# Patient Record
Sex: Male | Born: 1975 | Hispanic: No | Marital: Married | Smoking: Current some day smoker
Health system: Southern US, Community
[De-identification: ages and names within clinical notes are randomized; demographics above are authoritative.]

## PROBLEM LIST (undated history)

## (undated) DIAGNOSIS — I10 Essential (primary) hypertension: Secondary | ICD-10-CM

---

## 2009-03-17 ENCOUNTER — Encounter: Admission: RE | Admit: 2009-03-17 | Discharge: 2009-03-17 | Payer: Self-pay | Admitting: Pulmonary Disease

## 2011-01-02 IMAGING — CR DG CHEST 1V
1 series · 1 of 1 positions shown · non-contrast
Comparison: None

CLINICAL DATA: Positive PPD.  No current complaints.

CHEST - 1 VIEW

[w chest pa]
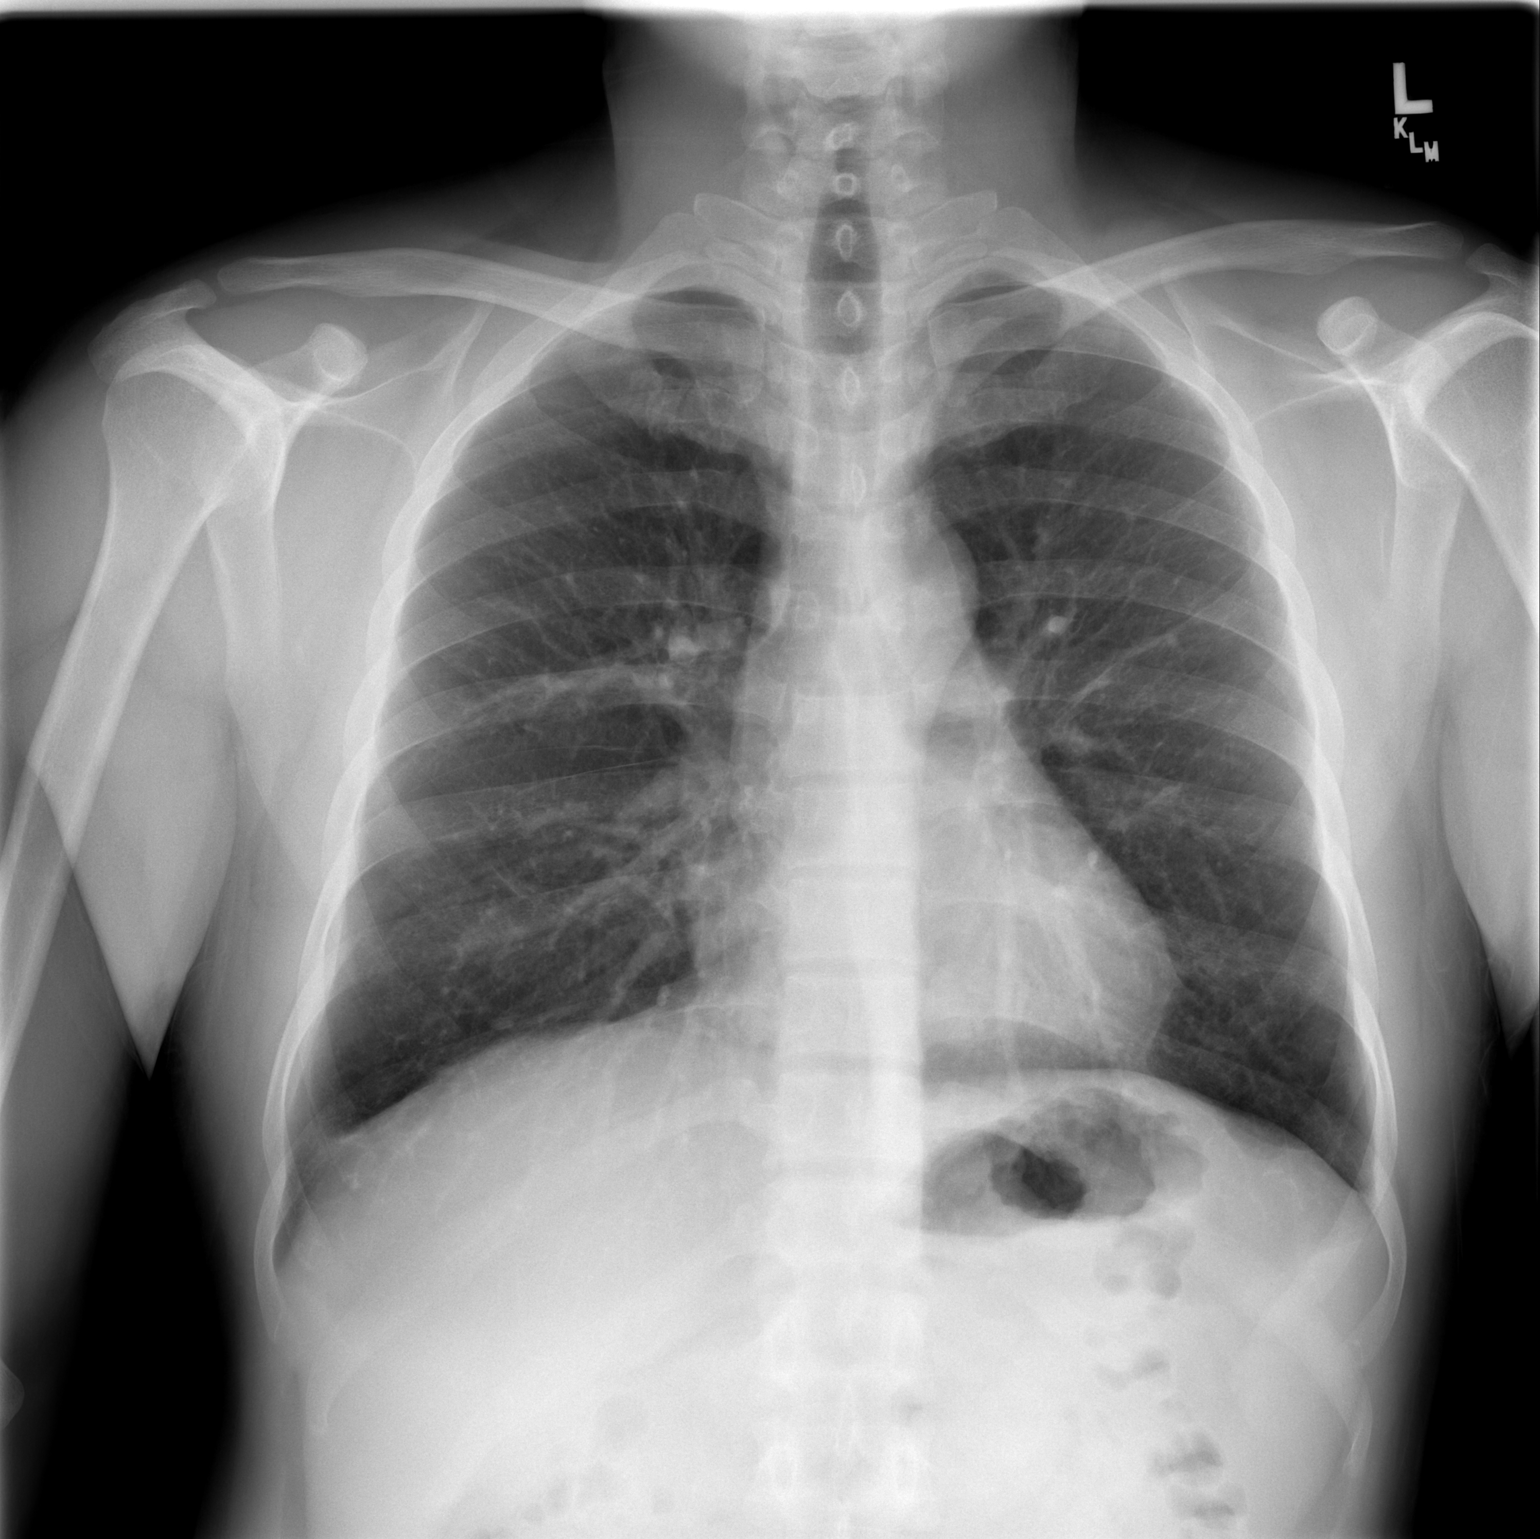

[1 of 1 positions shown; findings below may reference images not displayed]

FINDINGS: Heart size is normal.

No pleural effusions or pulmonary edema.

There are no airspace densities identified.
IMPRESSION: 1.  No acute cardiopulmonary abnormalities
2.  No evidence for granulomatous inflammation or infection.

## 2015-11-27 ENCOUNTER — Encounter (HOSPITAL_COMMUNITY): Payer: Self-pay | Admitting: *Deleted

## 2015-11-27 ENCOUNTER — Emergency Department (HOSPITAL_COMMUNITY)
Admission: EM | Admit: 2015-11-27 | Discharge: 2015-11-27 | Disposition: A | Discharge status: Home / Self Care | Payer: BLUE CROSS/BLUE SHIELD | Attending: Emergency Medicine | Admitting: Emergency Medicine

## 2015-11-27 ENCOUNTER — Emergency Department (HOSPITAL_COMMUNITY): Payer: BLUE CROSS/BLUE SHIELD

## 2015-11-27 DIAGNOSIS — M545 Low back pain: Secondary | ICD-10-CM | POA: Diagnosis present

## 2015-11-27 DIAGNOSIS — M25552 Pain in left hip: Secondary | ICD-10-CM | POA: Diagnosis not present

## 2015-11-27 DIAGNOSIS — Z79899 Other long term (current) drug therapy: Secondary | ICD-10-CM | POA: Diagnosis not present

## 2015-11-27 LAB — URINALYSIS, ROUTINE W REFLEX MICROSCOPIC
Bilirubin Urine: NEGATIVE
GLUCOSE, UA: NEGATIVE mg/dL
HGB URINE DIPSTICK: NEGATIVE
KETONES UR: NEGATIVE mg/dL
Leukocytes, UA: NEGATIVE
Nitrite: NEGATIVE
PROTEIN: NEGATIVE mg/dL
Specific Gravity, Urine: 1.009 (ref 1.005–1.030)
pH: 7 (ref 5.0–8.0)

## 2015-11-27 MED ORDER — CYCLOBENZAPRINE HCL 10 MG PO TABS: 10.0000 mg | ORAL_TABLET | Freq: Three times a day (TID) | ORAL | Status: DC | PRN

## 2015-11-27 MED ORDER — IBUPROFEN 400 MG PO TABS
600.0000 mg | ORAL_TABLET | Freq: Once | ORAL | Status: AC
  Administered 2015-11-27: 600 mg via ORAL
  Filled 2015-11-27: qty 1
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
## 2015-11-27 NOTE — Discharge Instructions (Signed)
Hip Pain °Your hip is the joint between your upper legs and your lower pelvis. The bones, cartilage, tendons, and muscles of your hip joint perform a lot of work each day supporting your body weight and allowing you to move around. °Hip pain can range from a minor ache to severe pain in one or both of your hips. Pain may be felt on the inside of the hip joint near the groin, or the outside near the buttocks and upper thigh. You may have swelling or stiffness as well.  °HOME CARE INSTRUCTIONS  °· Take medicines only as directed by your health care provider. °· Apply ice to the injured area: °¨ Put ice in a plastic bag. °¨ Place a towel between your skin and the bag. °¨ Leave the ice on for 15-20 minutes at a time, 3-4 times a day. °· Keep your leg raised (elevated) when possible to lessen swelling. °· Avoid activities that cause pain. °· Follow specific exercises as directed by your health care provider. °· Sleep with a pillow between your legs on your most comfortable side. °· Record how often you have hip pain, the location of the pain, and what it feels like. °SEEK MEDICAL CARE IF:  °· You are unable to put weight on your leg. °· Your hip is red or swollen or very tender to touch. °· Your pain or swelling continues or worsens after 1 week. °· You have increasing difficulty walking. °· You have a fever. °SEEK IMMEDIATE MEDICAL CARE IF:  °· You have fallen. °· You have a sudden increase in pain and swelling in your hip. °MAKE SURE YOU:  °· Understand these instructions. °· Will watch your condition. °· Will get help right away if you are not doing well or get worse. °  °This information is not intended to replace advice given to you by your health care provider. Make sure you discuss any questions you have with your health care provider. °  °Document Released: 03/27/2010 Document Revised: 10/28/2014 Document Reviewed: 06/03/2013 °Elsevier Interactive Patient Education ©2016 [employer] ° °Musculoskeletal  Pain °Musculoskeletal pain is muscle and boney aches and pains. These pains can occur in any part of the body. Your caregiver may treat you without knowing the cause of the pain. They may treat you if blood or urine tests, X-rays, and other tests were normal.  °CAUSES °There is often not a definite cause or reason for these pains. These pains may be caused by a type of germ (virus). The discomfort may also come from overuse. Overuse includes working out too hard when your body is not fit. Boney aches also come from weather changes. Bone is sensitive to atmospheric pressure changes. °HOME CARE INSTRUCTIONS  °· Ask when your test results will be ready. Make sure you get your test results. °· Only take over-the-counter or prescription medicines for pain, discomfort, or fever as directed by your caregiver. If you were given medications for your condition, do not drive, operate machinery or power tools, or sign legal documents for 24 hours. Do not drink alcohol. Do not take sleeping pills or other medications that may interfere with treatment. °· Continue all activities unless the activities cause more pain. When the pain lessens, slowly resume normal activities. Gradually increase the intensity and duration of the activities or exercise. °· During periods of severe pain, bed rest may be helpful. Lay or sit in any position that is comfortable. °· Putting ice on the injured area. °¨ Put ice in a   bag. °¨ Place a towel between your skin and the bag. °¨ Leave the ice on for 15 to 20 minutes, 3 to 4 times a day. °· Follow up with your caregiver for continued problems and no reason can be found for the pain. If the pain becomes worse or does not go away, it may be necessary to repeat tests or do additional testing. Your caregiver may need to look further for a possible cause. °SEEK IMMEDIATE MEDICAL CARE IF: °· You have pain that is getting worse and is not relieved by medications. °· You develop chest pain that is associated  with shortness or breath, sweating, feeling sick to your stomach (nauseous), or throw up (vomit). °· Your pain becomes localized to the abdomen. °· You develop any new symptoms that seem different or that concern you. °MAKE SURE YOU:  °· Understand these instructions. °· Will watch your condition. °· Will get help right away if you are not doing well or get worse. °  °This information is not intended to replace advice given to you by your health care provider. Make sure you discuss any questions you have with your health care provider. °  °Document Released: 10/07/2005 Document Revised: 12/30/2011 Document Reviewed: 06/11/2013 °Elsevier Interactive Patient Education ©2016 Elsevier Inc. ° °
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
## 2017-12-01 ENCOUNTER — Encounter (HOSPITAL_COMMUNITY): Payer: Self-pay | Admitting: Emergency Medicine

## 2017-12-01 ENCOUNTER — Ambulatory Visit (HOSPITAL_COMMUNITY)
Admission: EM | Admit: 2017-12-01 | Discharge: 2017-12-01 | Disposition: A | Discharge status: Home / Self Care | Payer: BLUE CROSS/BLUE SHIELD

## 2017-12-01 ENCOUNTER — Other Ambulatory Visit: Payer: Self-pay

## 2017-12-01 DIAGNOSIS — J988 Other specified respiratory disorders: Secondary | ICD-10-CM | POA: Diagnosis not present

## 2017-12-01 DIAGNOSIS — B9789 Other viral agents as the cause of diseases classified elsewhere: Secondary | ICD-10-CM | POA: Diagnosis not present

## 2017-12-01 MED ORDER — BENZONATATE 200 MG PO CAPS: 200.0000 mg | ORAL_CAPSULE | Freq: Three times a day (TID) | ORAL | 0 refills | Status: DC | PRN

## 2017-12-01 MED ORDER — IBUPROFEN 800 MG PO TABS: 800.0000 mg | ORAL_TABLET | Freq: Three times a day (TID) | ORAL | 0 refills | Status: DC

## 2017-12-01 NOTE — ED Triage Notes (Signed)
Fever started Friday.  Patient has had a cough, itchy, runny nose, leg pain.

## 2017-12-01 NOTE — Discharge Instructions (Signed)
Take the medications as I have prescribed them I think you will feel better in the next few days.  Make sure you are drinking lots of water throughout the day tomorrow if you plan to go to work.

## 2017-12-01 NOTE — ED Provider Notes (Signed)
12/01/2017 9:18 PM   DOB: 11/24/1975 / MRN: [id number]  SUBJECTIVE:  Ryan Compton is a 42 y.o. male presenting for cough, fever, chills.  States the symptoms started 3 nights ago.  He feels he is getting worse.  He has not had the seasonal flu shot.  He associates low backache and leg myalgia.  He is asking to go to work tomorrow.  He has been hydrating.  He has No Known Allergies.   He  has no past medical history on file.    He  reports that he does not drink alcohol or use drugs. He  has no sexual activity history on file. The patient  has no past surgical history on file.  His family history is not on file.  ROS  As per HPI otherwise negative.  OBJECTIVE:  BP 125/80 (BP Location: Left Arm)   Pulse 82   Temp 100 F (37.8 C) (Oral)   Resp (!) 2   SpO2 99%    BP Readings from Last 3 Encounters:  12/01/17 125/80  11/27/15 134/90     Physical Exam  Constitutional: He appears well-developed. He is active and cooperative.  Non-toxic appearance.  Cardiovascular: Normal rate, regular rhythm, S1 normal, S2 normal, normal heart sounds, intact distal pulses and normal pulses. Exam reveals no gallop and no friction rub.  No murmur heard. Pulmonary/Chest: Effort normal. No stridor. No tachypnea. No respiratory distress. He has no wheezes. He has no rales.  Abdominal: He exhibits no distension.  Musculoskeletal: He exhibits no edema.  Neurological: He is alert.  Skin: Skin is warm and dry. He is not diaphoretic. No pallor.  Vitals reviewed.   No results found for this or any previous visit (from the past 72 hour(s)).  No results found.  ASSESSMENT AND PLAN:  No orders of the defined types were placed in this encounter.    Viral respiratory illness: Patient with most likely resolving flu.  His vital signs are normal and he has an excellent respiratory and cardiac exam today.  He does not need antibiotics.  Tamiflu would not be helpful.      The patient is advised to  call or return to clinic if he does not see an improvement in symptoms, or to seek the care of the closest emergency department if he worsens with the above plan.   Ryan Compton, MHS, PA-C 12/01/2017 9:18 PM    Ofilia Neaslark, Ryan Kudo L, PA-C 12/01/17 2119

## 2021-04-10 ENCOUNTER — Encounter (HOSPITAL_COMMUNITY): Payer: Self-pay | Admitting: Emergency Medicine

## 2021-04-10 ENCOUNTER — Other Ambulatory Visit: Payer: Self-pay

## 2021-04-10 ENCOUNTER — Ambulatory Visit (HOSPITAL_COMMUNITY)
Admission: EM | Admit: 2021-04-10 | Discharge: 2021-04-10 | Disposition: A | Discharge status: Home / Self Care | Payer: BC Managed Care – PPO

## 2021-04-10 DIAGNOSIS — L237 Allergic contact dermatitis due to plants, except food: Secondary | ICD-10-CM | POA: Diagnosis not present

## 2021-04-10 HISTORY — DX: Essential (primary) hypertension: I10

## 2021-04-10 MED ORDER — DIPHENHYDRAMINE HCL 25 MG PO TABS: 25.0000 mg | ORAL_TABLET | Freq: Four times a day (QID) | ORAL | 0 refills | Status: DC | PRN

## 2021-04-10 MED ORDER — PREDNISONE 20 MG PO TABS: 40.0000 mg | ORAL_TABLET | Freq: Every day | ORAL | 0 refills | Status: AC

## 2021-04-10 NOTE — ED Provider Notes (Signed)
[facility]    CSN: [id number] Arrival date & time: 04/10/21  1640      History   Chief Complaint Chief Complaint  Patient presents with   Rash    HPI Marlon Suleiman is a 45 y.o. male presenting for itchy rash for 1 week. States he did spend time outside. Rash started as itchy patches on buttocks, though this has resolved. Now with few patches on abd wall and LEs, describes this as itchy. OTC medications providing no relief. Denies shortness of breath, facial swelling, pharyngeal swelling.   HPI  Past Medical History:  Diagnosis Date   Hypertension     There are no problems to display for this patient.   History reviewed. No pertinent surgical history.     Home Medications    Prior to Admission medications   Medication Sig Start Date End Date Taking? Authorizing Provider  diphenhydrAMINE (BENADRYL) 25 MG tablet Take 1 tablet (25 mg total) by mouth every 6 (six) hours as needed. 04/10/21  Yes Rhys Martini, PA-C  predniSONE (DELTASONE) 20 MG tablet Take 2 tablets (40 mg total) by mouth daily for 5 days. 04/10/21 04/15/21 Yes Rhys Martini, PA-C  losartan (COZAAR) 50 MG tablet Take 1 tablet by mouth daily. 03/14/21   [provider]    Family History History reviewed. No pertinent family history.  Social History Social History   Tobacco Use   Smoking status: Some Days    Pack years: 0.00    Types: Cigarettes   Smokeless tobacco: Never  Vaping Use   Vaping Use: Never used  Substance Use Topics   Alcohol use: Yes   Drug use: No     Allergies   Patient has no known allergies.   Review of Systems Review of Systems  Skin:  Positive for rash.  All other systems reviewed and are negative.   Physical Exam Triage Vital Signs ED Triage Vitals  Enc Vitals Group     BP 04/10/21 1710 134/86     Pulse Rate 04/10/21 1710 72     Resp 04/10/21 1710 18     Temp 04/10/21 1710 (!) 97.4 F (36.3 C)     Temp Source 04/10/21 1710 Oral      SpO2 04/10/21 1710 98 %     Weight --      Height --      Head Circumference --      Peak Flow --      Pain Score 04/10/21 1702 0     Pain Loc --      Pain Edu? --      Excl. in GC? --    No data found.  Updated Vital Signs BP 134/86 (BP Location: Left Arm)   Pulse 72   Temp (!) 97.4 F (36.3 C) (Oral)   Resp 18   SpO2 98%   Visual Acuity Right Eye Distance:   Left Eye Distance:   Bilateral Distance:    Right Eye Near:   Left Eye Near:    Bilateral Near:     Physical Exam Vitals reviewed.  Constitutional:      General: He is not in acute distress.    Appearance: Normal appearance. He is not ill-appearing or diaphoretic.  HENT:     Head: Normocephalic and atraumatic.  Cardiovascular:     Rate and Rhythm: Normal rate and regular rhythm.     Heart sounds: Normal heart sounds.  Pulmonary:     Effort: Pulmonary effort  is normal.     Breath sounds: Normal breath sounds.  Skin:    General: Skin is warm.     Comments: Erythematous urticarial rash on abd wall and few patches on bilateral LEs. No facial, pharyngeal, throat, uvula, lip, tongue swelling.  Neurological:     General: No focal deficit present.     Mental Status: He is alert and oriented to person, place, and time.  Psychiatric:        Mood and Affect: Mood normal.        Behavior: Behavior normal.        Thought Content: Thought content normal.        Judgment: Judgment normal.     UC Treatments / Results  Labs (all labs ordered are listed, but only abnormal results are displayed) Labs Reviewed - No data to display  EKG   Radiology No results found.  Procedures Procedures (including critical care time)  Medications Ordered in UC Medications - No data to display  Initial Impression / Assessment and Plan / UC Course  I have reviewed the triage vital signs and the nursing notes.  Pertinent labs & imaging results that were available during my care of the patient were reviewed by me and  considered in my medical decision making (see chart for details).     This patient is a 45 year old male presenting with contact dermatitis.  Oxygenating well on room air.  Prednisone sent as below.  Benadryl for symptomatic relief  ED return precautions discussed. Patient verbalizes understanding and agreement.   Final Clinical Impressions(s) / UC Diagnoses   Final diagnoses:  Allergic contact dermatitis due to plants, except food     Discharge Instructions      -Prednisone, 2 pills taken at the same time for 5 days in a row.  Try taking this earlier in the day as it can give you energy.  -Benadryl (diphenhydramine) for symptomatic relief, I recommend taking this at bedtime as it can cause drowsiness.  This will help with itching. -Seek additional medical attention if symptoms worsen despite treatment, or new symptoms like facial swelling, throat swelling, shortness of breath.     ED Prescriptions     Medication Sig Dispense Auth. Provider   predniSONE (DELTASONE) 20 MG tablet Take 2 tablets (40 mg total) by mouth daily for 5 days. 10 tablet Rhys Martini, PA-C   diphenhydrAMINE (BENADRYL) 25 MG tablet Take 1 tablet (25 mg total) by mouth every 6 (six) hours as needed. 30 tablet Rhys Martini, PA-C      PDMP not reviewed this encounter.   Rhys Martini, PA-C 04/10/21 1814

## 2021-04-10 NOTE — ED Triage Notes (Addendum)
itchy rash for one week, generalized, fine rash.  Rash includes trunk and extremities

## 2021-04-10 NOTE — Discharge Instructions (Addendum)
-  Prednisone, 2 pills taken at the same time for 5 days in a row.  Try taking this earlier in the day as it can give you energy.  -Benadryl (diphenhydramine) for symptomatic relief, I recommend taking this at bedtime as it can cause drowsiness.  This will help with itching. -Seek additional medical attention if symptoms worsen despite treatment, or new symptoms like facial swelling, throat swelling, shortness of breath.

## 2021-11-29 ENCOUNTER — Other Ambulatory Visit: Payer: Self-pay

## 2021-11-29 ENCOUNTER — Other Ambulatory Visit: Payer: Self-pay | Admitting: Family Medicine

## 2021-11-29 ENCOUNTER — Ambulatory Visit
Admission: RE | Admit: 2021-11-29 | Discharge: 2021-11-29 | Disposition: A | Discharge status: Home / Self Care | Payer: BC Managed Care – PPO | Source: Ambulatory Visit | Attending: Family Medicine | Admitting: Family Medicine

## 2021-11-29 DIAGNOSIS — R1031 Right lower quadrant pain: Secondary | ICD-10-CM

## 2022-11-22 ENCOUNTER — Encounter (HOSPITAL_COMMUNITY): Payer: Self-pay

## 2022-11-22 ENCOUNTER — Ambulatory Visit (HOSPITAL_COMMUNITY)
Admission: EM | Admit: 2022-11-22 | Discharge: 2022-11-22 | Disposition: A | Discharge status: Home / Self Care | Payer: BC Managed Care – PPO | Attending: Family Medicine | Admitting: Family Medicine

## 2022-11-22 ENCOUNTER — Ambulatory Visit (INDEPENDENT_AMBULATORY_CARE_PROVIDER_SITE_OTHER): Payer: BC Managed Care – PPO

## 2022-11-22 DIAGNOSIS — R208 Other disturbances of skin sensation: Secondary | ICD-10-CM

## 2022-11-22 DIAGNOSIS — R300 Dysuria: Secondary | ICD-10-CM

## 2022-11-22 LAB — POCT URINALYSIS DIPSTICK, ED / UC
Bilirubin Urine: NEGATIVE
Glucose, UA: NEGATIVE mg/dL
Hgb urine dipstick: NEGATIVE
Ketones, ur: NEGATIVE mg/dL
Leukocytes,Ua: NEGATIVE
Nitrite: NEGATIVE
Protein, ur: NEGATIVE mg/dL
Specific Gravity, Urine: 1.015 (ref 1.005–1.030)
Urobilinogen, UA: 1 mg/dL (ref 0.0–1.0)
pH: 6 (ref 5.0–8.0)

## 2022-11-22 NOTE — Discharge Instructions (Addendum)
You were seen today for burning sensation at the right abdomen/flank.  Your urine was normal today, no sign of infection.  Your xray did show moderate stool in the colon.  I am not sure if this is related to your symptoms or not, but you may wish to use over the counter medication to help with this, such as mirilax or colace.   As discussed, I am not sure what is the cause of your symptoms, although this could be a pinched nerve of some sort.  I recommend you continue tylenol or motrin for pain.  There is nothing emergent at this time.  Please follow up with your primary care provider for further work up and discussion of this problem.

## 2022-11-22 NOTE — ED Triage Notes (Addendum)
Patient c/o right abdominal pain and right flank pain x 1 month., but constant in the past week. Patient denies any N/V/D. Patient states that he had dysuria yesterday. Patient denies any urinary frequency.  Patient speaks Nepali, but has a family member present to interpret.

## 2022-11-22 NOTE — ED Provider Notes (Signed)
[facility]    CSN: [id number] Arrival date & time: 11/22/22  0973      History   Chief Complaint Chief Complaint  Patient presents with   Abdominal Pain   Flank Pain    HPI Ryan Compton is a 47 y.o. male.   Patient is here for pain at the right flank, right lateral abdomen.  Burning sensation.  He has had pain/burning sensation off/on for the last month.  If he takes tylenol it does resolve.  However, for the week it has been constant.  At times he feels it in the right lateral hip/leg.  Nothing makes it worse necessarily.  He describes this as a burning sensation.  Not pain per se.  It feels "hot" to the touch at times.  No rash noted.  No n/v.  No blood in his urine.  Some painful urination off/on.  No fevers/chills.  When it happens he does stop what he's doing at times.  He works for [employer] in [employer], drives a fork lift, moving boxes, etc.  No known injury when this initially started.       Past Medical History:  Diagnosis Date   Hypertension     There are no problems to display for this patient.   History reviewed. No pertinent surgical history.     Home Medications    Prior to Admission medications   Medication Sig Start Date End Date Taking? Authorizing Provider  diphenhydrAMINE (BENADRYL) 25 MG tablet Take 1 tablet (25 mg total) by mouth every 6 (six) hours as needed. 04/10/21   Hazel Sams, PA-C  losartan (COZAAR) 50 MG tablet Take 1 tablet by mouth daily. 03/14/21   [provider]    Family History History reviewed. No pertinent family history.  Social History Social History   Tobacco Use   Smoking status: Some Days    Types: Cigarettes   Smokeless tobacco: Never  Vaping Use   Vaping Use: Never used  Substance Use Topics   Alcohol use: Yes   Drug use: No     Allergies   Patient has no known allergies.   Review of Systems Review of Systems  Constitutional: Negative.   HENT: Negative.     Respiratory: Negative.    Gastrointestinal:  Positive for abdominal pain.  Genitourinary:  Positive for flank pain.  Hematological: Negative.   Psychiatric/Behavioral: Negative.       Physical Exam Triage Vital Signs ED Triage Vitals  Enc Vitals Group     BP 11/22/22 1015 (!) 155/91     Pulse Rate 11/22/22 1015 72     Resp 11/22/22 1015 16     Temp 11/22/22 1015 98.7 F (37.1 C)     Temp Source 11/22/22 1015 Oral     SpO2 11/22/22 1015 97 %     Weight --      Height --      Head Circumference --      Peak Flow --      Pain Score 11/22/22 1018 6     Pain Loc --      Pain Edu? --      Excl. in [place]? --    No data found.  Updated Vital Signs BP (!) 155/91 (BP Location: Right Arm)   Pulse 72   Temp 98.7 F (37.1 C) (Oral)   Resp 16   SpO2 97%   Visual Acuity Right Eye Distance:   Left Eye Distance:   Bilateral Distance:  Right Eye Near:   Left Eye Near:    Bilateral Near:     Physical Exam Cardiovascular:     Rate and Rhythm: Normal rate and regular rhythm.  Pulmonary:     Effort: Pulmonary effort is normal.     Breath sounds: Normal breath sounds.  Abdominal:     General: Abdomen is flat. Bowel sounds are normal.     Palpations: Abdomen is soft.     Tenderness: There is no abdominal tenderness.     Comments: He has TTP to the right flank, just above the iliac crest in the soft tissues;  no guarding, rebound noted;    Musculoskeletal:     Comments: No TTP to the right iliac crest;  no ttp to the right lateral hip;  full rom without pain or limitation at the right hip  Skin:    General: Skin is warm.  Neurological:     General: No focal deficit present.     Mental Status: He is alert.      UC Treatments / Results  Labs (all labs ordered are listed, but only abnormal results are displayed) Labs Reviewed  POCT URINALYSIS DIPSTICK, ED / UC    EKG   Radiology No results found.  Procedures Procedures (including critical care  time)  Medications Ordered in UC Medications - No data to display  Initial Impression / Assessment and Plan / UC Course  I have reviewed the triage vital signs and the nursing notes.  Pertinent labs & imaging results that were available during my care of the patient were reviewed by me and considered in my medical decision making (see chart for details).    Final Clinical Impressions(s) / UC Diagnoses   Final diagnoses:  Burning sensation of skin  Dysuria     Discharge Instructions      You were seen today for burning sensation at the right abdomen/flank.  Your urine was normal today, no sign of infection.  Your xray did show moderate stool in the colon.  I am not sure if this is related to your symptoms or not, but you may wish to use over the counter medication to help with this, such as mirilax or colace.   As discussed, I am not sure what is the cause of your symptoms, although this could be a pinched nerve of some sort.  I recommend you continue tylenol or motrin for pain.  There is nothing emergent at this time.  Please follow up with your primary care provider for further work up and discussion of this problem.     ED Prescriptions   None    PDMP not reviewed this encounter.   Rondel Oh, MD 11/22/22 1115

## 2023-09-16 IMAGING — CT CT ABD-PELV W/O CM
1 of 2 series · 14 of 32 positions shown, 19 images · non-contrast
Comparison: None.

CLINICAL DATA: Right lower quadrant pain for 2 weeks. Colonoscopy 1
month ago



[Series 2: abd/pelvis w/(date) · axial · 0.67mm/px · z∈[+361,+791]mm · 14 of 98 slices shown, 19 images]
[im 6/98  soft-tissue]
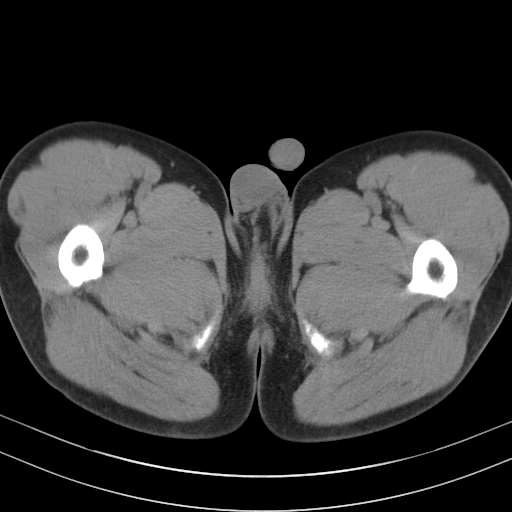
[im 6/98  bone]
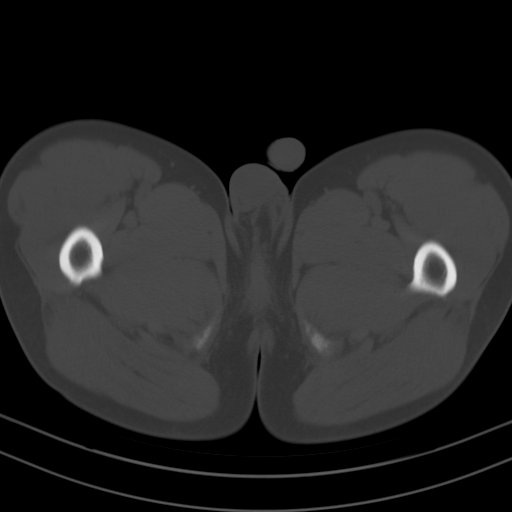
[im 12/98  soft-tissue]
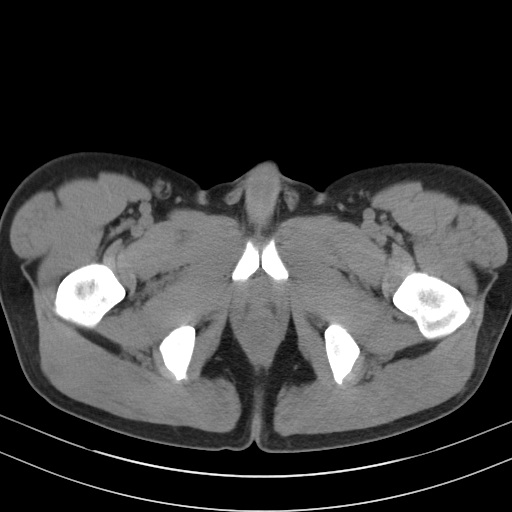
[im 23/98  soft-tissue]
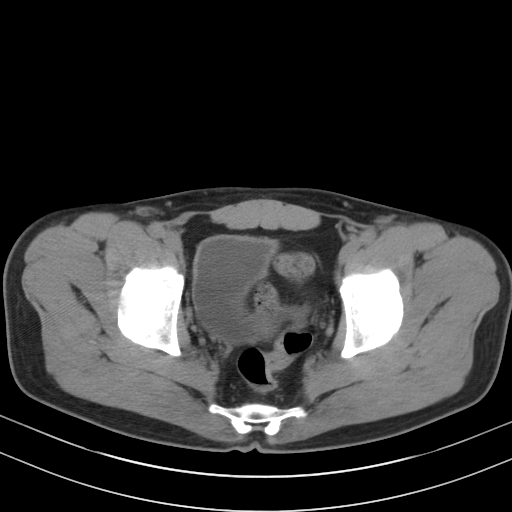
[im 29/98  soft-tissue]
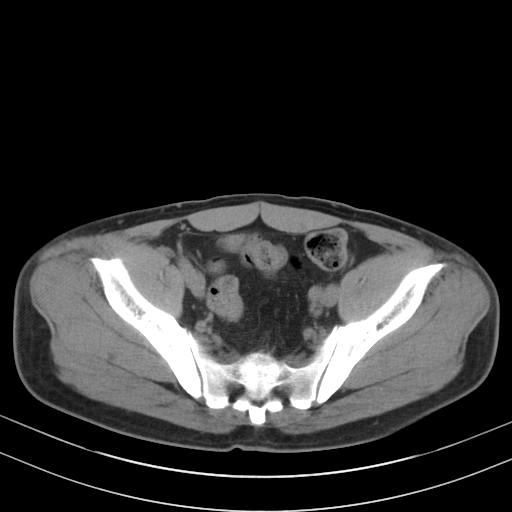
[im 35/98  soft-tissue]
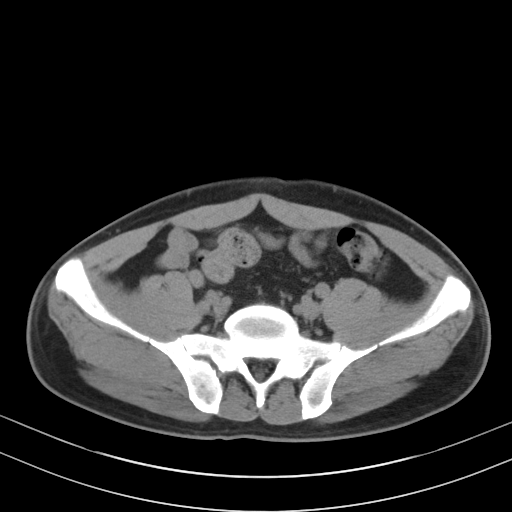
[im 40/98  soft-tissue]
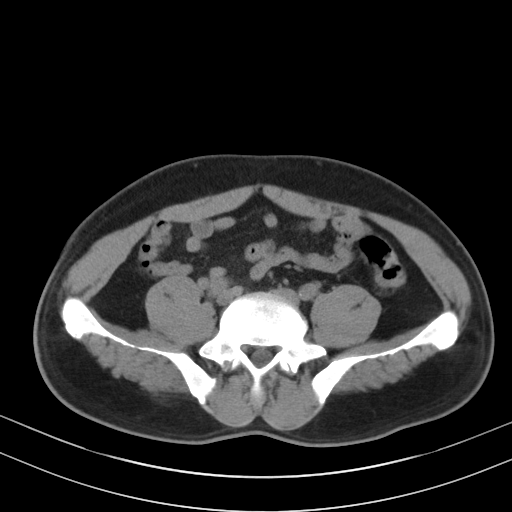
[im 52/98  soft-tissue]
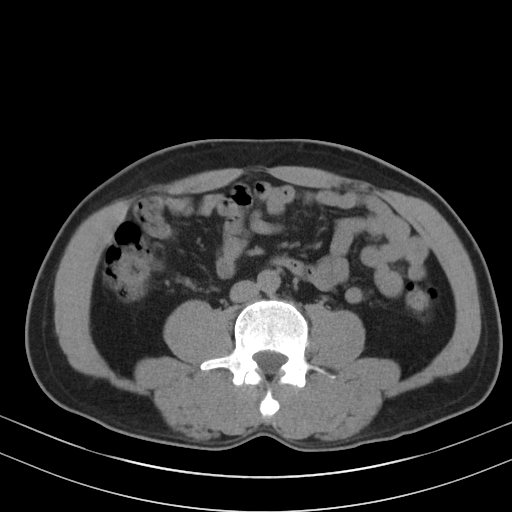
[im 58/98  soft-tissue]
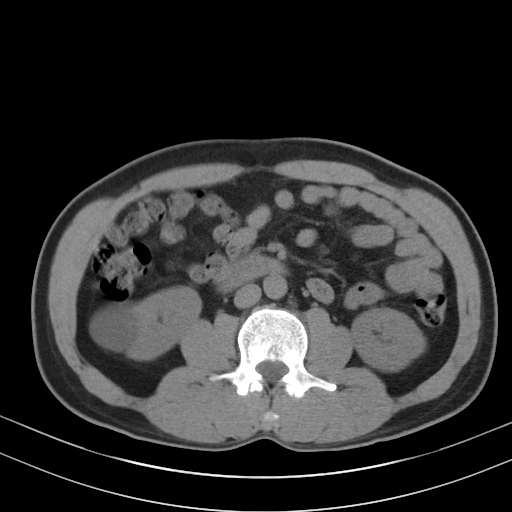
[im 63/98  soft-tissue]
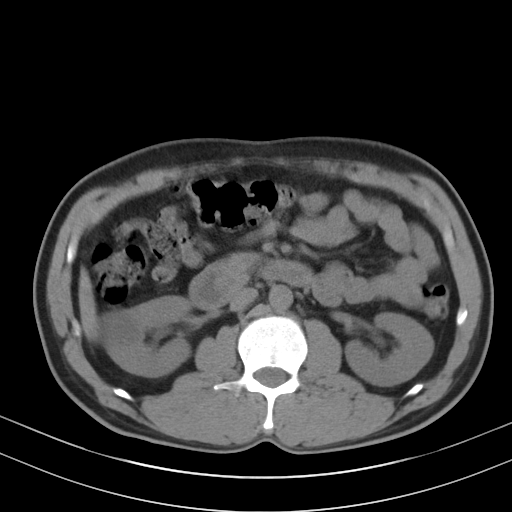
[im 63/98  bone]
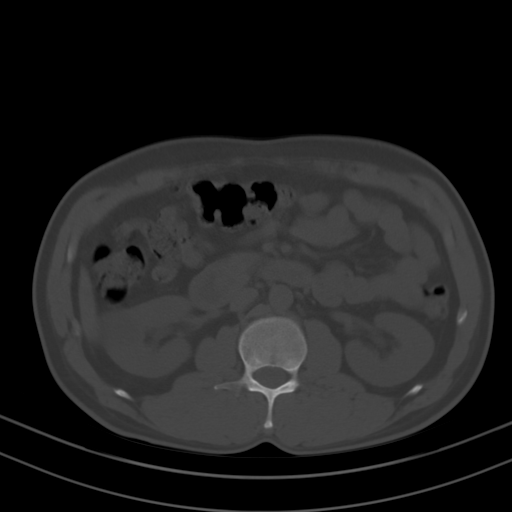
[im 69/98  soft-tissue]
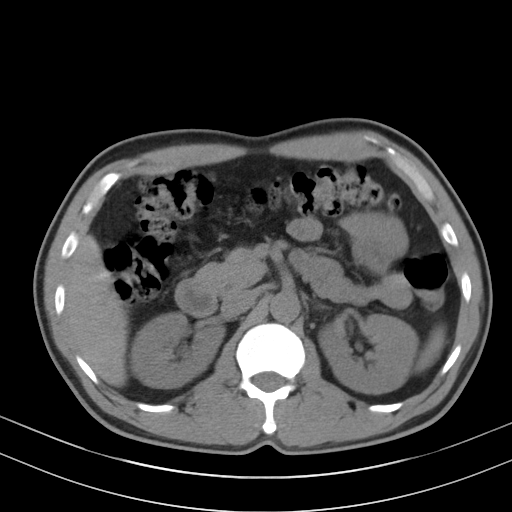
[im 75/98  soft-tissue]
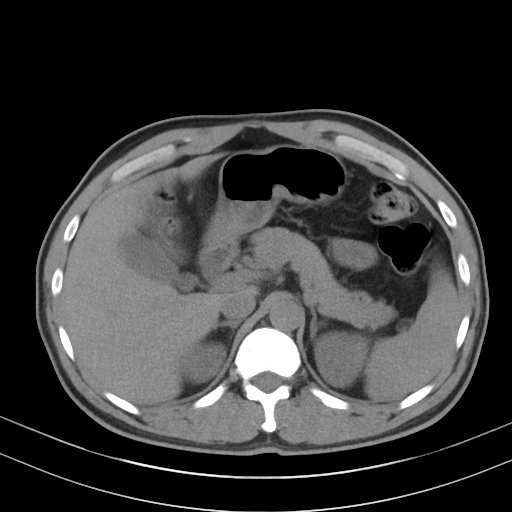
[im 75/98  lung]
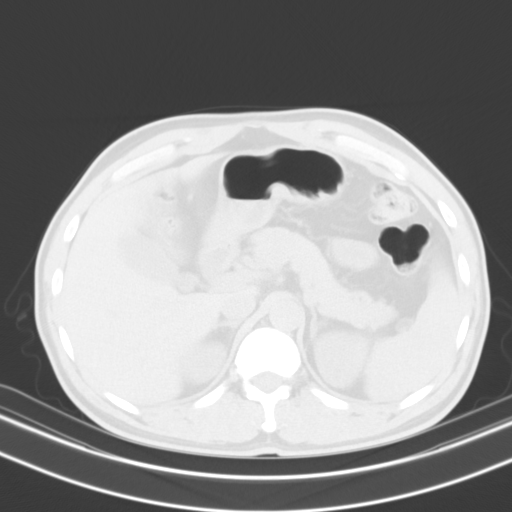
[im 80/98  lung]
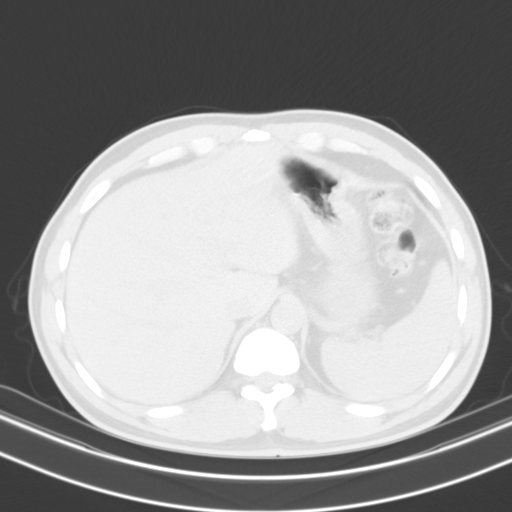
[im 86/98  soft-tissue]
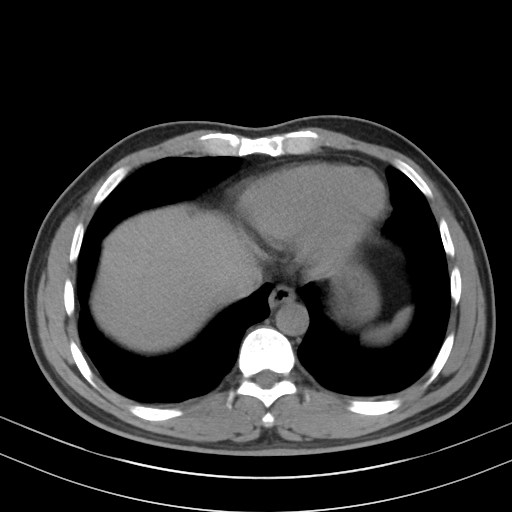
[im 86/98  lung]
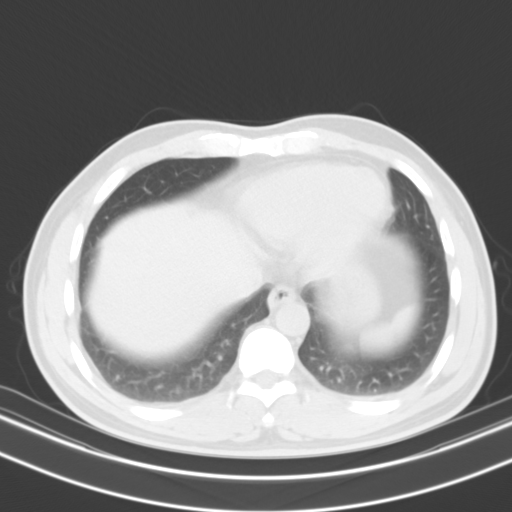
[im 92/98  soft-tissue]
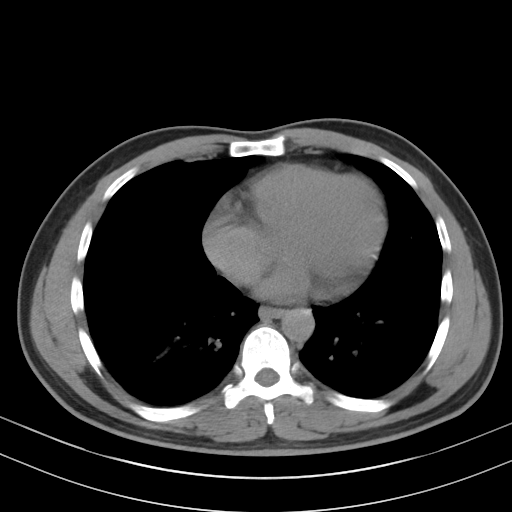
[im 92/98  lung]
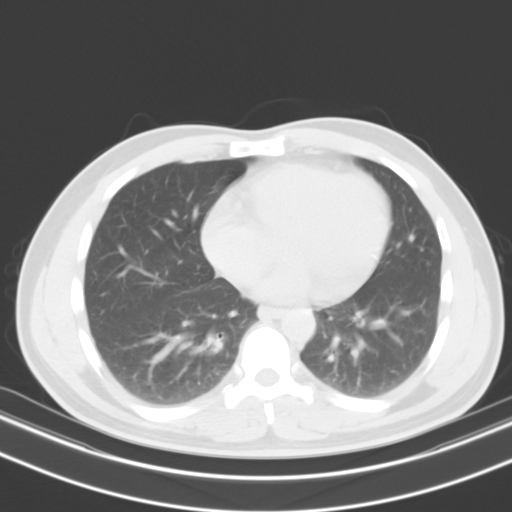

[14 of 32 positions shown; findings below may reference images not displayed]

FINDINGS: Lower chest: No acute abnormality.

Hepatobiliary: Unremarkable unenhanced appearance of the liver. No
focal liver lesion identified. Gallbladder within normal limits. No
hyperdense gallstone. No biliary dilatation.

Pancreas: Unremarkable. No pancreatic ductal dilatation or
surrounding inflammatory changes.

Spleen: Normal in size without focal abnormality.

Adrenals/Urinary Tract: Unremarkable adrenal glands. 3.7 cm cyst at
the mid to lower pole of the right kidney. Kidneys are otherwise
within normal limits. No renal stone or hydronephrosis. Minimal
urinary bladder wall thickening.

Stomach/Bowel: Stomach is within normal limits. Appendix appears
normal (series 2, image 50). No evidence of bowel wall thickening,
distention, or inflammatory changes.

Vascular/Lymphatic: No significant vascular findings are present. No
enlarged abdominal or pelvic lymph nodes.

Reproductive: There are a few coarse calcifications within a
nonenlarged prostate gland.

Other: No free fluid. No abdominopelvic fluid collection. No
pneumoperitoneum. No abdominal wall hernia.

Musculoskeletal: No acute or significant osseous findings.
Assimilation joint on the right at the lumbosacral junction.
IMPRESSION: 1. No acute abdominopelvic findings. Normal appendix.
2. Minimal urinary bladder wall thickening, which may be accentuated
by under-distension. Correlate with urinalysis to exclude the
possibility of cystitis.

## 2024-04-03 ENCOUNTER — Encounter (HOSPITAL_COMMUNITY): Payer: Self-pay | Admitting: Emergency Medicine

## 2024-04-03 ENCOUNTER — Ambulatory Visit (HOSPITAL_COMMUNITY)
Admission: EM | Admit: 2024-04-03 | Discharge: 2024-04-03 | Disposition: A | Discharge status: Home / Self Care | Attending: Family Medicine | Admitting: Family Medicine

## 2024-04-03 DIAGNOSIS — K0510 Chronic gingivitis, plaque induced: Secondary | ICD-10-CM | POA: Diagnosis not present

## 2024-04-03 MED ORDER — AMOXICILLIN-POT CLAVULANATE 875-125 MG PO TABS: 1.0000 | ORAL_TABLET | Freq: Two times a day (BID) | ORAL | 0 refills | Status: AC

## 2024-04-03 NOTE — ED Triage Notes (Signed)
 Patient c/o oral swelling x 2 days.  No injury.  Patient has taken Tylenol for pain.

## 2024-04-05 NOTE — ED Provider Notes (Signed)
 [facility]   [id number] 04/03/24 Arrival Time: 1738  ASSESSMENT & PLAN:  1. Gingivitis    No signs of gum necrosis. Begin: Meds ordered this encounter  Medications   amoxicillin-clavulanate (AUGMENTIN) 875-125 MG tablet    Sig: Take 1 tablet by mouth every 12 (twelve) hours.    Dispense:  20 tablet    Refill:  0   See AVS for dental resource guide and information given.   Follow-up Information     [facility].   Specialty: Emergency Medicine Why: If symptoms worsen in any way. Contact information: [address] [place]  [phone]                Reviewed expectations re: course of current medical issues. Questions answered. Outlined signs and symptoms indicating need for more acute intervention. Understanding verbalized. After Visit Summary given.   SUBJECTIVE: History from: Patient. Ryan Compton is a 48 y.o. male. Patient c/o oral swelling x 2 days.  No injury.  Patient has taken Tylenol for pain. Gums do bleed sometimes. Denies: fever. Normal PO intake without n/v/d.  OBJECTIVE:  Vitals:   04/03/24 1804  BP: (!) 159/77  Pulse: 66  Resp: 18  Temp: 98.6 F (37 C)  TempSrc: Oral  SpO2: 98%    General appearance: alert; no distress Eyes: PERRLA; EOMI; conjunctiva normal HENT: [place]; AT; without nasal congestion; gums irritated/inflamed; normal pink color with some redness in places Neck: supple  Lungs: speaks full sentences without difficulty; unlabored Extremities: no edema Skin: warm and dry Neurologic: normal gait Psychological: alert and cooperative; normal mood and affect  Labs:  Labs Reviewed - No data to display  Imaging: No results found.  No Known Allergies  Past Medical History:  Diagnosis Date   Hypertension    Social History   Socioeconomic History   Marital status: Married    Spouse name: Not on file   Number of children: Not on  file   Years of education: Not on file   Highest education level: Not on file  Occupational History   Not on file  Tobacco Use   Smoking status: Some Days    Types: Cigarettes   Smokeless tobacco: Never  Vaping Use   Vaping status: Never Used  Substance and Sexual Activity   Alcohol use: Yes   Drug use: No   Sexual activity: Yes  Other Topics Concern   Not on file  Social History Narrative   Not on file   Social Drivers of Health   Financial Resource Strain: Not on file  Food Insecurity: Low Risk  (06/10/2023)   Received from [facility]   Hunger Vital Sign    Within the past 12 months, you worried that your food would run out before you got money to buy more: Never true    Within the past 12 months, the food you bought just didn't last and you didn't have money to get more. : Never true  Transportation Needs: No Transportation Needs (06/10/2023)   Received from [facility]    In the past 12 months, has lack of reliable transportation kept you from medical appointments, meetings, work or from getting things needed for daily living? : No  Physical Activity: Not on file  Stress: Not on file  Social Connections: Not on file  Intimate Partner Violence: Not on file   History reviewed. No pertinent family history. History reviewed. No pertinent surgical  history.   Afton Albright, MD 04/05/24 1258
